# Patient Record
Sex: Female | Born: 1985 | Hispanic: Yes | Marital: Single | Smoking: Never smoker
Health system: Southern US, Community
[De-identification: ages and names within clinical notes are randomized; demographics above are authoritative.]

---
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
## 2010-12-27 ENCOUNTER — Emergency Department: Payer: Self-pay | Admitting: Emergency Medicine

## 2011-10-22 ENCOUNTER — Emergency Department: Payer: Self-pay | Admitting: Emergency Medicine

## 2011-10-22 LAB — URINALYSIS, COMPLETE
Bilirubin,UR: NEGATIVE
Glucose,UR: NEGATIVE mg/dL (ref 0–75)
Nitrite: NEGATIVE
Protein: 100
RBC,UR: 1671 /HPF (ref 0–5)
Specific Gravity: 1.021 (ref 1.003–1.030)
WBC UR: 117 /HPF (ref 0–5)

## 2011-10-22 LAB — COMPREHENSIVE METABOLIC PANEL
Anion Gap: 13 (ref 7–16)
Calcium, Total: 8.7 mg/dL (ref 8.5–10.1)
Chloride: 105 mmol/L (ref 98–107)
Co2: 22 mmol/L (ref 21–32)
Creatinine: 0.62 mg/dL (ref 0.60–1.30)
EGFR (Non-African Amer.): >60
Potassium: 3.4 mmol/L — ABNORMAL LOW (ref 3.5–5.1)
SGOT(AST): 20 U/L (ref 15–37)
Sodium: 140 mmol/L (ref 136–145)

## 2011-10-22 LAB — CBC WITH DIFFERENTIAL/PLATELET
Basophil #: 0 10*3/uL (ref 0.0–0.1)
Eosinophil #: 0.1 10*3/uL (ref 0.0–0.7)
Lymphocyte #: 2.2 10*3/uL (ref 1.0–3.6)
MCHC: 34 g/dL (ref 32.0–36.0)
MCV: 92 fL (ref 80–100)
Monocyte %: 5.5 %
Platelet: 200 10*3/uL (ref 150–440)
RDW: 11.8 % (ref 11.5–14.5)
WBC: 7.7 10*3/uL (ref 3.6–11.0)

## 2011-10-22 LAB — PREGNANCY, URINE: Pregnancy Test, Urine: NEGATIVE m[IU]/mL

## 2011-10-31 ENCOUNTER — Ambulatory Visit: Payer: Self-pay | Admitting: Urology

## 2012-08-10 ENCOUNTER — Emergency Department: Payer: Self-pay | Admitting: Emergency Medicine

## 2012-08-10 LAB — COMPREHENSIVE METABOLIC PANEL
Albumin: 4.1 g/dL (ref 3.4–5.0)
BUN: 6 mg/dL — ABNORMAL LOW (ref 7–18)
Bilirubin,Total: 0.4 mg/dL (ref 0.2–1.0)
Chloride: 104 mmol/L (ref 98–107)
Co2: 26 mmol/L (ref 21–32)
Creatinine: 0.67 mg/dL (ref 0.60–1.30)
EGFR (African American): >60
EGFR (Non-African Amer.): >60
Glucose: 101 mg/dL — ABNORMAL HIGH (ref 65–99)
Osmolality: 277 (ref 275–301)
SGPT (ALT): 33 U/L (ref 12–78)
Sodium: 140 mmol/L (ref 136–145)
Total Protein: 8.2 g/dL (ref 6.4–8.2)

## 2012-08-10 LAB — URINALYSIS, COMPLETE
Bacteria: NONE SEEN
Glucose,UR: NEGATIVE mg/dL (ref 0–75)
Ketone: NEGATIVE
Nitrite: NEGATIVE
Ph: 7 (ref 4.5–8.0)
Specific Gravity: 1.005 (ref 1.003–1.030)
Squamous Epithelial: 3
WBC UR: 2 /HPF (ref 0–5)

## 2012-08-10 LAB — CBC
HCT: 37.9 % (ref 35.0–47.0)
MCH: 31.5 pg (ref 26.0–34.0)
MCHC: 35 g/dL (ref 32.0–36.0)
RDW: 12.4 % (ref 11.5–14.5)

## 2012-12-15 IMAGING — CR DG ABDOMEN 1V
1 series · 1 of 1 positions shown · non-contrast
Comparison: none

REASON FOR EXAM: back pain  calculus
COMMENTS:

[t abdomen supine]
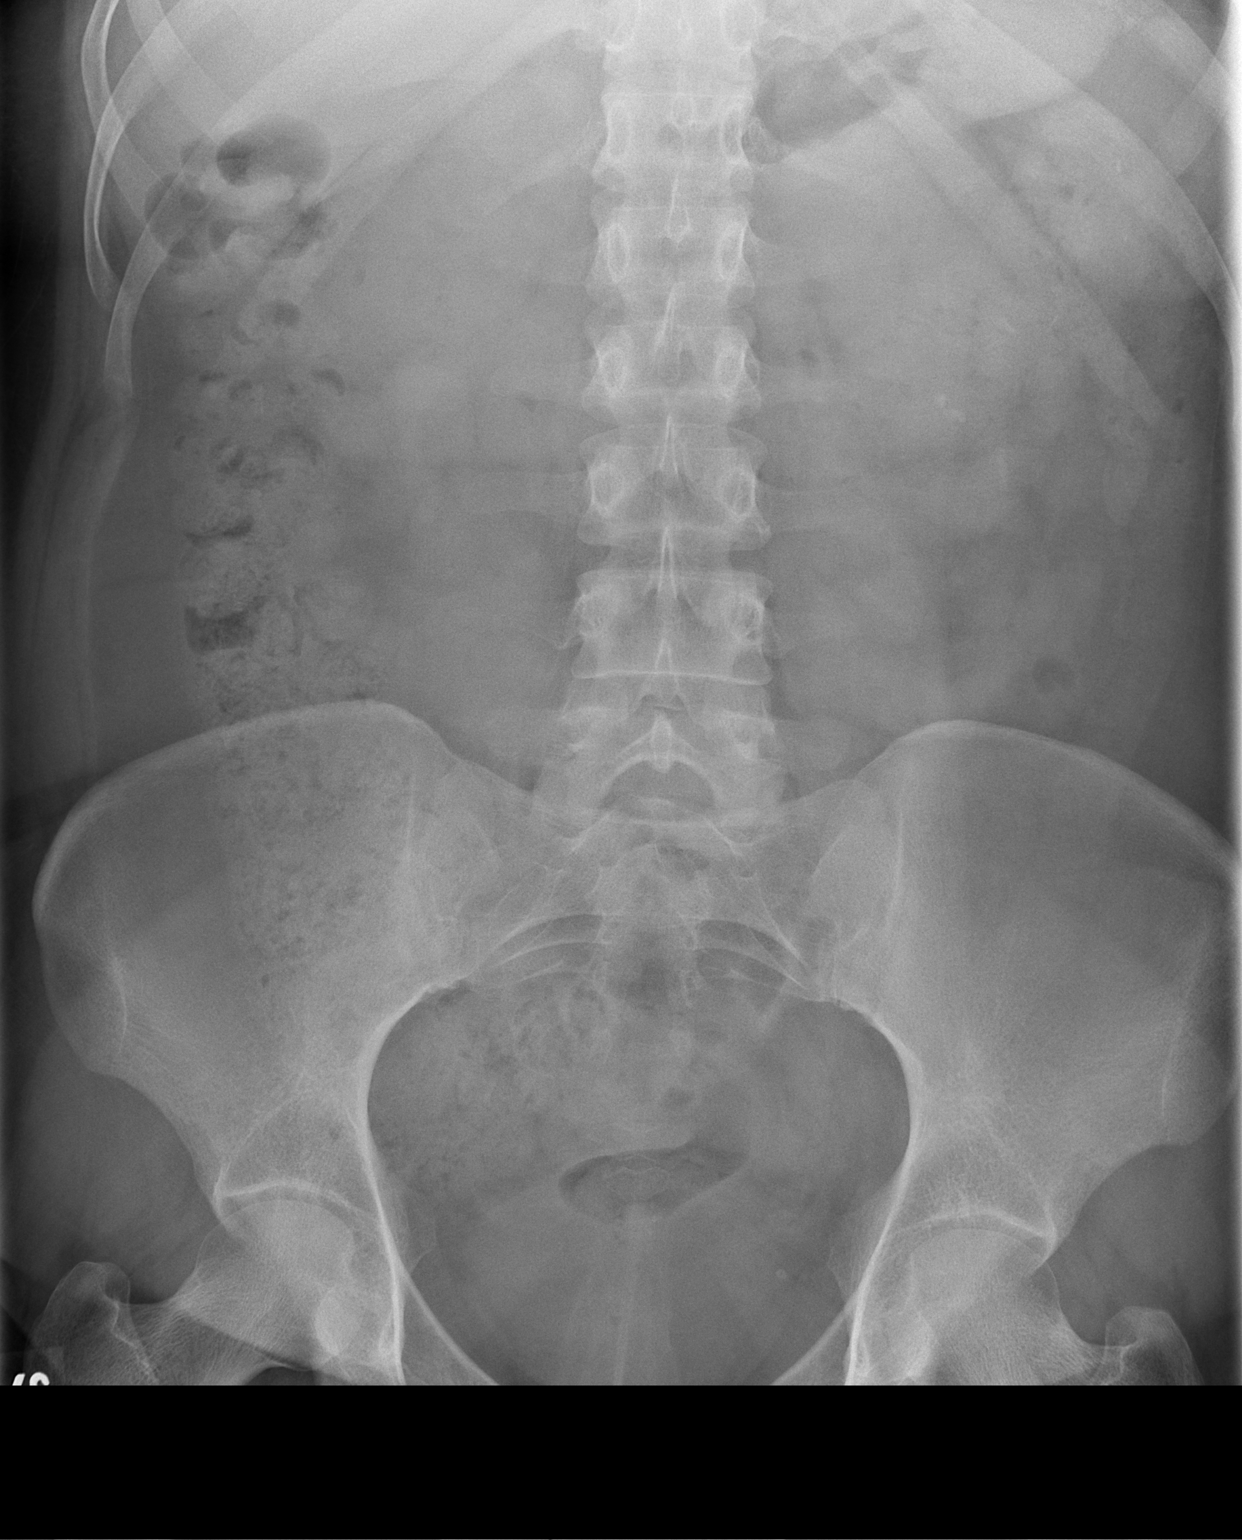

[1 of 1 positions shown; findings below may reference images not displayed]

PROCEDURE:     DXR - DXR KIDNEY URETER BLADDER  - October 31, 2011  [DATE]

RESULT:     There are observed a few calcifications at the lower and midpole
region of the left kidney. No right renal stones are identified. No ureteral
calcifications are identified on either side. The left ureteral stone
previously observed at CT is not definitely identified. The bowel gas
pattern is normal in appearance. The osseous structures show no significant
abnormalities.
IMPRESSION: Left nephrolithiasis.

## 2013-06-17 ENCOUNTER — Emergency Department: Payer: Self-pay | Admitting: Emergency Medicine

## 2013-06-17 LAB — URINALYSIS, COMPLETE
Bilirubin,UR: NEGATIVE
Glucose,UR: NEGATIVE mg/dL (ref 0–75)
Hyaline Cast: 2
Nitrite: NEGATIVE
Ph: 7 (ref 4.5–8.0)
Protein: NEGATIVE
RBC,UR: 9 /HPF (ref 0–5)
Specific Gravity: 1.012 (ref 1.003–1.030)
Squamous Epithelial: 16

## 2013-06-17 LAB — BASIC METABOLIC PANEL
Anion Gap: 7 (ref 7–16)
Calcium, Total: 8.7 mg/dL (ref 8.5–10.1)
Chloride: 105 mmol/L (ref 98–107)
Co2: 22 mmol/L (ref 21–32)
Creatinine: 0.61 mg/dL (ref 0.60–1.30)
EGFR (African American): >60
EGFR (Non-African Amer.): >60
Sodium: 134 mmol/L — ABNORMAL LOW (ref 136–145)

## 2013-06-17 LAB — CBC
HGB: 13.4 g/dL (ref 12.0–16.0)
MCV: 90 fL (ref 80–100)
Platelet: 218 10*3/uL (ref 150–440)
RBC: 4.3 10*6/uL (ref 3.80–5.20)
WBC: 9.5 10*3/uL (ref 3.6–11.0)

## 2013-06-17 LAB — HCG, QUANTITATIVE, PREGNANCY: Beta Hcg, Quant.: 84027 m[IU]/mL — ABNORMAL HIGH

## 2013-07-21 ENCOUNTER — Encounter: Payer: Self-pay | Admitting: Obstetrics and Gynecology

## 2013-07-23 ENCOUNTER — Encounter: Payer: Self-pay | Admitting: Obstetrics and Gynecology

## 2013-08-25 ENCOUNTER — Encounter: Payer: Self-pay | Admitting: Obstetrics and Gynecology

## 2013-09-08 ENCOUNTER — Encounter: Payer: Self-pay | Admitting: Obstetrics and Gynecology

## 2013-09-30 ENCOUNTER — Observation Stay: Payer: Self-pay | Admitting: Obstetrics and Gynecology

## 2013-11-16 ENCOUNTER — Ambulatory Visit: Payer: Self-pay | Admitting: Advanced Practice Midwife

## 2013-11-20 ENCOUNTER — Ambulatory Visit: Payer: Self-pay | Admitting: Advanced Practice Midwife

## 2013-12-21 ENCOUNTER — Ambulatory Visit: Payer: Self-pay | Admitting: Advanced Practice Midwife

## 2014-01-02 ENCOUNTER — Observation Stay: Payer: Self-pay | Admitting: Obstetrics and Gynecology

## 2014-01-02 LAB — URINALYSIS, COMPLETE
BACTERIA: NONE SEEN
Bilirubin,UR: NEGATIVE
Glucose,UR: NEGATIVE mg/dL (ref 0–75)
Ketone: NEGATIVE
Nitrite: NEGATIVE
Ph: 7 (ref 4.5–8.0)
Protein: NEGATIVE
Specific Gravity: 1.005 (ref 1.003–1.030)
Squamous Epithelial: 3

## 2014-01-04 LAB — URINE CULTURE

## 2014-01-16 ENCOUNTER — Inpatient Hospital Stay: Payer: Self-pay | Admitting: Obstetrics and Gynecology

## 2014-01-16 LAB — CBC WITH DIFFERENTIAL/PLATELET
Basophil #: 0.1 10*3/uL (ref 0.0–0.1)
Basophil %: 0.7 %
EOS ABS: 0.1 10*3/uL (ref 0.0–0.7)
Eosinophil %: 0.9 %
HCT: 41.2 % (ref 35.0–47.0)
HGB: 13.9 g/dL (ref 12.0–16.0)
Lymphocyte #: 1.8 10*3/uL (ref 1.0–3.6)
Lymphocyte %: 17.6 %
MCH: 32.2 pg (ref 26.0–34.0)
MCHC: 33.7 g/dL (ref 32.0–36.0)
MCV: 96 fL (ref 80–100)
Monocyte #: 0.7 x10 3/mm (ref 0.2–0.9)
Monocyte %: 7.2 %
NEUTROS ABS: 7.4 10*3/uL — AB (ref 1.4–6.5)
NEUTROS PCT: 73.6 %
PLATELETS: 178 10*3/uL (ref 150–440)
RBC: 4.31 10*6/uL (ref 3.80–5.20)
RDW: 13.4 % (ref 11.5–14.5)
WBC: 10.1 10*3/uL (ref 3.6–11.0)

## 2014-01-17 LAB — HEMATOCRIT: HCT: 34.4 % — ABNORMAL LOW (ref 35.0–47.0)

## 2014-01-18 LAB — PATHOLOGY REPORT

## 2014-02-12 ENCOUNTER — Emergency Department: Payer: Self-pay | Admitting: Emergency Medicine

## 2014-02-12 LAB — COMPREHENSIVE METABOLIC PANEL
ALBUMIN: 3.8 g/dL (ref 3.4–5.0)
ALK PHOS: 116 U/L
ALT: 103 U/L — AB (ref 12–78)
Anion Gap: 6 — ABNORMAL LOW (ref 7–16)
BILIRUBIN TOTAL: 0.7 mg/dL (ref 0.2–1.0)
BUN: 13 mg/dL (ref 7–18)
CHLORIDE: 105 mmol/L (ref 98–107)
CO2: 27 mmol/L (ref 21–32)
Calcium, Total: 9.3 mg/dL (ref 8.5–10.1)
Creatinine: 0.79 mg/dL (ref 0.60–1.30)
EGFR (African American): >60
GLUCOSE: 104 mg/dL — AB (ref 65–99)
OSMOLALITY: 276 (ref 275–301)
POTASSIUM: 3.5 mmol/L (ref 3.5–5.1)
SGOT(AST): 103 U/L — ABNORMAL HIGH (ref 15–37)
SODIUM: 138 mmol/L (ref 136–145)
Total Protein: 7.8 g/dL (ref 6.4–8.2)

## 2014-02-12 LAB — URINALYSIS, COMPLETE
Bacteria: NONE SEEN
Bilirubin,UR: NEGATIVE
Glucose,UR: NEGATIVE mg/dL (ref 0–75)
Ketone: NEGATIVE
LEUKOCYTE ESTERASE: NEGATIVE
NITRITE: NEGATIVE
Ph: 7 (ref 4.5–8.0)
Protein: NEGATIVE
Specific Gravity: 1.013 (ref 1.003–1.030)

## 2014-02-12 LAB — CBC WITH DIFFERENTIAL/PLATELET
Basophil #: 0 10*3/uL (ref 0.0–0.1)
Basophil %: 0.2 %
Eosinophil #: 0.1 10*3/uL (ref 0.0–0.7)
Eosinophil %: 0.9 %
HCT: 41.6 % (ref 35.0–47.0)
HGB: 13.8 g/dL (ref 12.0–16.0)
LYMPHS PCT: 12.2 %
Lymphocyte #: 1.6 10*3/uL (ref 1.0–3.6)
MCH: 31.1 pg (ref 26.0–34.0)
MCHC: 33.2 g/dL (ref 32.0–36.0)
MCV: 94 fL (ref 80–100)
Monocyte #: 0.6 x10 3/mm (ref 0.2–0.9)
Monocyte %: 4.4 %
Neutrophil #: 10.7 10*3/uL — ABNORMAL HIGH (ref 1.4–6.5)
Neutrophil %: 82.3 %
Platelet: 203 10*3/uL (ref 150–440)
RBC: 4.44 10*6/uL (ref 3.80–5.20)
RDW: 12.8 % (ref 11.5–14.5)
WBC: 13 10*3/uL — ABNORMAL HIGH (ref 3.6–11.0)

## 2014-02-12 LAB — PREGNANCY, URINE: Pregnancy Test, Urine: NEGATIVE m[IU]/mL

## 2014-02-13 LAB — LIPASE, BLOOD: Lipase: 291 U/L (ref 73–393)

## 2015-01-13 NOTE — Op Note (Signed)
PATIENT NAME:  Brittany Petersen, Brittany Petersen MR#:  [id number] DATE OF BIRTH:  [birth year]  DATE OF PROCEDURE:  01/16/2014  PREOPERATIVE DIAGNOSES:  1.  At 39 and +1 weeks.  2.  Active contractions.  3.  Elective repeat cesarean section.  4.  Elective permanent sterilization.   PROCEDURE PERFORMED: 1.  Repeat low transverse cesarean section.  2.  Pomeroy bilateral tubal ligation.   ANESTHESIA: Spinal.   SURGEON: Suzy Bouchard, M.D.   FIRST ASSISTANT: Yetta Barre.   INDICATION: This is a 29 year old gravida 2, para 1, EDC of 01/22/2014. The patient had elected for a repeat cesarean section and elective permanent sterilization. The patient came in the day of the procedure with active contractions. The cervix was 1 to 2 cm dilated.   PROCEDURE IN DETAIL: After adequate spinal anesthesia and receiving 2 grams IV Ancef, the patient was placed in the dorsal supine position with a hip roll under the right side. The patient's abdomen was prepped and draped in normal sterile fashion. Pfannenstiel incision was made 2 fingerbreadths above the symphysis pubis. Sharp dissection was used to identify the fascia. The fascia was opened in the midline and opened in a transverse fashion. The superior aspect of the fascia was grasped with Kocher clamps and the recti muscles were dissected free. The inferior aspect of the fascia was grasped with Kocher clamps and pyramidalis muscle was dissected free. The peritoneal cavity was opened sharply. There were some omental adhesions that were easily placed to the side. Vesicouterine  peritoneal fold was identified and a bladder flap was created and the bladder was reflected inferiorly. A low transverse uterine incision was made. Upon entry into the endometrial cavity, clear fluid resulted. The incision was extended with blunt transverse traction. The fetal head was brought to the incision and vacuum was applied. A vacuum was applied to the occiput and gentle pull allowed for  delivery of the head and the vacuum was removed. The shoulders and body were delivered without difficulty. A small, vigorous female whose cord was doubly clamped and passed to nursery staff to assign Apgar scores of 9 and 9, weight 2640 grams. The placenta was manually removed. The uterus was exteriorized and the patient received IV Pitocin. Endometrial cavity was wiped clean with laparotomy tape and the cervix was opened with a ring forceps. The uterine incision was closed with 1 chromic suture in a running locking fashion with good approximation of edges. One additional figure-of-eight suture was required for good hemostasis. The posterior cul-de-sac was irrigated and suctioned. Attention was then directed to the patient's right fallopian tube, which was grasped in the midportion and 2 separate 0 plain gut sutures were applied and a 1.5 cm portion of fallopian tube was removed. Good hemostasis was noted. A similar procedure was repeated on the patient's left fallopian tube; again, 2 separate 0 plain gut sutures were applied at the midportion of the fallopian tube and a 1.5 cm portion of the fallopian tube removed. Good hemostasis was noted. The uterus was placed back into the abdominal cavity and the paracolic gutters were wiped clean bilaterally, and the tubal ligation sites appeared hemostatic. The uterine incision appeared hemostatic. The fascia was then closed with 0 Vicryl in a running nonlocking fashion. Subcutaneous tissues were irrigated and bovied for hemostasis, and the skin was reapproximated with staples. The patient tolerated the procedure well was taken to the recovery room in good condition.   INTRAOPERATIVE FLUIDS: 1300 mL.   ESTIMATED BLOOD LOSS: 600 mL.  ____________________________ Suzy Bouchardhomas J. Rylin Saez, MD tjs:dmm D: 01/16/2014 15:16:06 ET T: 01/16/2014 20:08:16 ET JOB#: [id number]  cc: Suzy Bouchardhomas J. Madelin Weseman, MD, <Dictator> Suzy BouchardHOMAS J Jakhiya Brower MD ELECTRONICALLY SIGNED  01/21/2014 12:06

## 2015-01-30 NOTE — H&P (Signed)
L&D Evaluation:  History:  HPI 29 yo G2P1001 with LMP of 04/17/13 & EDD of 01/22/14 and 01/25/14 by US at 13 1/7 here for labor with UC's q 3 mins and cx of 2cms her for RCS. PNC significant for Tdap given at 28 weeks, Obesity, A1GDM, desires RCS and BTL. No VB,no decreased FM, ROM. GBs neg   Presents with contractions, Active Labor pattern   Patient's Medical History A1GDM, GBs neg, Obesity, UTI,   Patient's Surgical History Fx Rt foot,   Medications Pre Natal Vitamins   Allergies NKDA   Social History none   Family History Non-Contributory   ROS:  ROS All systems were reviewed.  HEENT, CNS, GI, GU, Respiratory, CV, Renal and Musculoskeletal systems were found to be normal.   Exam:  Vital Signs stable   General no apparent distress   Mental Status clear   Chest clear   Heart normal sinus rhythm, no murmur/gallop/rubs   Abdomen gravid, non-tender   Estimated Fetal Weight Average for gestational age   Fetal Position vtx   Back no CVAT   Edema 1+   Reflexes 1+   Clonus negative   Pelvic 1-2cms   Mebranes Intact   FHT normal rate with no decels   Ucx regular   Skin dry   Lymph no lymphadenopathy   Impression:  Impression active labor, IUP at 38 5/7 weeks in active labor   Plan:  Plan monitor contractions and for cervical change, Admit for RCS with BTL   Comments Risks, benefits and alternatives including bleeding, damage to internal organs, infeciton disc with pt and she accepts the risk. Anesthesia risks will be discussed by anesthesia via Interpreter. Prepping for a LTCS/BTL. Anesthesia, Dr. Randel PiggPolin called and made aware of the C-section.   Electronic Signatures: Sharee PimpleJones, Caron W (CNM)  (Signed 27-Apr-15 13:42)  Authored: L&D Evaluation   Last Updated: 27-Apr-15 13:42 by Sharee PimpleJones, Caron W (CNM)

## 2016-02-27 ENCOUNTER — Emergency Department: Payer: Self-pay

## 2016-02-27 ENCOUNTER — Encounter: Payer: Self-pay | Admitting: Emergency Medicine

## 2016-02-27 ENCOUNTER — Emergency Department
Admission: EM | Admit: 2016-02-27 | Discharge: 2016-02-27 | Disposition: A | Discharge status: Home / Self Care | Payer: Self-pay | Attending: Emergency Medicine | Admitting: Emergency Medicine

## 2016-02-27 DIAGNOSIS — N23 Unspecified renal colic: Secondary | ICD-10-CM | POA: Insufficient documentation

## 2016-02-27 LAB — COMPREHENSIVE METABOLIC PANEL
ALT: 30 U/L (ref 14–54)
AST: 36 U/L (ref 15–41)
Albumin: 4.3 g/dL (ref 3.5–5.0)
Alkaline Phosphatase: 70 U/L (ref 38–126)
Anion gap: 14 (ref 5–15)
BUN: 6 mg/dL (ref 6–20)
CHLORIDE: 104 mmol/L (ref 101–111)
CO2: 19 mmol/L — AB (ref 22–32)
CREATININE: 0.55 mg/dL (ref 0.44–1.00)
Calcium: 8.9 mg/dL (ref 8.9–10.3)
Glucose, Bld: 148 mg/dL — ABNORMAL HIGH (ref 65–99)
POTASSIUM: 3.3 mmol/L — AB (ref 3.5–5.1)
Sodium: 137 mmol/L (ref 135–145)
Total Bilirubin: 0.5 mg/dL (ref 0.3–1.2)
Total Protein: 7.9 g/dL (ref 6.5–8.1)

## 2016-02-27 LAB — URINALYSIS COMPLETE WITH MICROSCOPIC (ARMC ONLY)
BILIRUBIN URINE: NEGATIVE
Glucose, UA: NEGATIVE mg/dL
LEUKOCYTES UA: NEGATIVE
Nitrite: NEGATIVE
PH: 6 (ref 5.0–8.0)
PROTEIN: 30 mg/dL — AB
Specific Gravity, Urine: 1.017 (ref 1.005–1.030)

## 2016-02-27 LAB — CBC
HEMATOCRIT: 37.9 % (ref 35.0–47.0)
HEMOGLOBIN: 13 g/dL (ref 12.0–16.0)
MCH: 29.3 pg (ref 26.0–34.0)
MCHC: 34.3 g/dL (ref 32.0–36.0)
MCV: 85.3 fL (ref 80.0–100.0)
Platelets: 233 10*3/uL (ref 150–440)
RBC: 4.44 MIL/uL (ref 3.80–5.20)
RDW: 13.9 % (ref 11.5–14.5)
WBC: 7.4 10*3/uL (ref 3.6–11.0)

## 2016-02-27 LAB — LIPASE, BLOOD: LIPASE: 22 U/L (ref 11–51)

## 2016-02-27 LAB — POCT PREGNANCY, URINE: PREG TEST UR: NEGATIVE

## 2016-02-27 MED ORDER — KETOROLAC TROMETHAMINE 30 MG/ML IJ SOLN
30.0000 mg | Freq: Once | INTRAMUSCULAR | Status: AC
  Administered 2016-02-27: 30 mg via INTRAVENOUS
  Filled 2016-02-27: qty 1

## 2016-02-27 MED ORDER — ONDANSETRON HCL 4 MG/2ML IJ SOLN
4.0000 mg | Freq: Once | INTRAMUSCULAR | Status: DC
  Filled 2016-02-27: qty 2

## 2016-02-27 MED ORDER — ONDANSETRON HCL 4 MG/2ML IJ SOLN
4.0000 mg | Freq: Once | INTRAMUSCULAR | Status: AC
  Administered 2016-02-27: 4 mg via INTRAVENOUS
  Filled 2016-02-27: qty 2

## 2016-02-27 MED ORDER — IBUPROFEN 600 MG PO TABS: 600.0000 mg | ORAL_TABLET | Freq: Four times a day (QID) | ORAL | Status: AC | PRN

## 2016-02-27 MED ORDER — ONDANSETRON 4 MG PO TBDP: 4.0000 mg | ORAL_TABLET | Freq: Four times a day (QID) | ORAL | Status: AC | PRN

## 2016-02-27 MED ORDER — ONDANSETRON HCL 4 MG/2ML IJ SOLN
4.0000 mg | Freq: Once | INTRAMUSCULAR | Status: AC
  Administered 2016-02-27: 4 mg via INTRAVENOUS

## 2016-02-27 MED ORDER — SODIUM CHLORIDE 0.9 % IV BOLUS (SEPSIS)
1000.0000 mL | Freq: Once | INTRAVENOUS | Status: AC
  Administered 2016-02-27: 1000 mL via INTRAVENOUS

## 2016-02-27 MED ORDER — ONDANSETRON HCL 4 MG/2ML IJ SOLN
INTRAMUSCULAR | Status: AC
  Administered 2016-02-27: 4 mg via INTRAVENOUS
  Filled 2016-02-27: qty 2

## 2016-02-27 MED ORDER — ONDANSETRON HCL 4 MG/2ML IJ SOLN
4.0000 mg | Freq: Once | INTRAMUSCULAR | Status: AC | PRN
Start: 2016-02-27 — End: 2016-02-27
  Administered 2016-02-27: 4 mg via INTRAVENOUS

## 2016-02-27 NOTE — ED Provider Notes (Signed)
[facility] Emergency Department Provider Note ____________________________________________  Time seen: Approximately 1:38 PM  I have reviewed the triage vital signs and the nursing notes.   HISTORY  Patient is Spanish-speaking and history and physical was assisted by hospital interpreter, Maryjane Hurter  Chief Complaint Pelvic Pain and Emesis   HPI Brittany Petersen is a 30 y.o. female was in her usual state of good health until 7 AM when she developed left lower quadrant and left flank pain similar to when she had a kidney stone 1-2 years ago. She has had yellow emesis. Pain is severe and is not better or worse with anything. She has not had any diarrhea. She had her regular period May 29-6/3 does not think there is any way she could be pregnant. She has not had any vaginal discomfort or discharge. She has not noticed any hematuria  When she had her past kidney stone, she did follow up with the urologist and they stone passed spontaneously. She is status post cholecystectomy in March. She has had 2 C-sections.  History reviewed. No pertinent past medical history.  There are no active problems to display for this patient.   History reviewed. No pertinent past surgical history.  No current outpatient prescriptions on file.  Allergies Review of patient's allergies indicates no known allergies.  No family history on file.  Social History Social History  Substance Use Topics  . Smoking status: Never Smoker   . Smokeless tobacco: None  . Alcohol Use: No    Review of Systems Constitutional: No fever/chills Eyes: No visual changes. ENT: No sore throat. Cardiovascular: Denies chest pain. Respiratory: Denies shortness of breath. Gastrointestinal:See history of present illness Genitourinary: Negative for dysuria. Musculoskeletal: Negative for back pain. Skin: Negative for rash. Neurological: Negative for headaches, focal weakness or numbness.  10-point  ROS otherwise negative.  ____________________________________________   PHYSICAL EXAM:  VITAL SIGNS: ED Triage Vitals  Enc Vitals Group     BP 02/27/16 1233 127/80 mmHg     Pulse Rate 02/27/16 1233 116     Resp 02/27/16 1233 20     Temp 02/27/16 1233 98.1 F (36.7 C)     Temp Source 02/27/16 1233 Oral     SpO2 02/27/16 1233 99 %     Weight 02/27/16 1233 175 lb (79.379 kg)     Height 02/27/16 1233  (1.575 m)     Head Cir --      Peak Flow --      Pain Score 02/27/16 1234 10     Pain Loc --      Pain Edu? --      Excl. in GC? --    Constitutional: Alert and oriented. Appears uncomfortable; standing and walking around the room  Eyes: Conjunctivae are normal. PERRL. EOMI. Head: Atraumatic. Nose: No congestion/rhinnorhea. Mouth/Throat: Mucous membranes are moist.  Oropharynx non-erythematous. Neck: No stridor.   Cardiovascular: Normal rate, regular rhythm. Grossly normal heart sounds.  Good peripheral circulation. Respiratory: Normal respiratory effort.  No retractions. Lungs CTAB. Gastrointestinal: Soft; mild tenderness to palpation left lower quadrant without rebound or guarding. No distention. No abdominal bruits. No CVA tenderness. Musculoskeletal: No lower extremity tenderness nor edema.   Neurologic:  Normal speech and language. No gross focal neurologic deficits are appreciated. No gait instability. Skin:  Skin is warm, dry and intact. No rash noted. Psychiatric: Mood and affect are normal. Speech and behavior are normal.  ____________________________________________   LABS (all labs ordered are listed, but only  abnormal results are displayed)  Labs Reviewed  COMPREHENSIVE METABOLIC PANEL - Abnormal; Notable for the following:    Potassium 3.3 (*)    CO2 19 (*)    Glucose, Bld 148 (*)    All other components within normal limits  URINALYSIS COMPLETEWITH MICROSCOPIC ([facility] ONLY) - Abnormal; Notable for the following:    Color, Urine YELLOW (*)    APPearance  HAZY (*)    Ketones, ur 1+ (*)    Hgb urine dipstick 3+ (*)    Protein, ur 30 (*)    Bacteria, UA RARE (*)    Squamous Epithelial / LPF 0-5 (*)    All other components within normal limits  LIPASE, BLOOD  CBC  POCT PREGNANCY, URINE  Urinalysis red blood cells-too numerous to count ____________________________________________ ____________________________________________  RADIOLOGY KUB-pending Renal ultrasound-pending ____________________________________________  ____________________________________________   INITIAL IMPRESSION / ASSESSMENT AND PLAN / ED COURSE  Pertinent labs & imaging results that were available during my care of the patient were reviewed by me and considered in my medical decision making (see chart for details).  Patient appears very uncomfortable; given that this feels like her past kidney stone and she currently has too numerous to count red blood cells in her urine while she is not menstruating, I have high suspicion for kidney stone. Will check KUB and kidney ultrasound.  ED care xferred to Dr. Fanny BienQuale ____________________________________________   FINAL CLINICAL IMPRESSION(S) / ED DIAGNOSES  Final diagnoses:  None      New prescriptions started this visit New Prescriptions   No medications on file     Maurilio LovelyNoelle Colm Lyford, MD 03/10/16 2339

## 2016-02-27 NOTE — ED Notes (Signed)
Per interpreter pt presents with lower left abd and pelvic pain and vomiting started this am. Pt denies any diarrhea.

## 2016-02-27 NOTE — ED Provider Notes (Signed)
-----------------------------------------   5:45 PM on 02/27/2016 -----------------------------------------  Patient reevaluated with Spanish interpreter. She reports to me that her pain has improved but still feels nauseated. She is having left lower quadrant discomfort that started earlier today. Based on her KUB showing no clear stone, though hematuria is noted. Ultrasound also does not demonstrate evidence of hydronephrosis. Based upon this, I have ordered ultrasound of the pelvis to rule out torsion as a possible alternative cause for her lower left-sided pelvic pain. Patient is requesting additional antiemetic, but states does not really require any additional pain medicine at this time.  US Pelvis Complete (Final result) Result time: 02/27/16 18:54:51   Final result by Rad Results In Interface (02/27/16 18:54:51)   Narrative:   CLINICAL DATA: 30 year old female with left lower quadrant abdominal pain. Evaluate for torsion.  EXAM: TRANSABDOMINAL AND TRANSVAGINAL ULTRASOUND OF PELVIS  DOPPLER ULTRASOUND OF OVARIES  TECHNIQUE: Both transabdominal and transvaginal ultrasound examinations of the pelvis were performed. Transabdominal technique was performed for global imaging of the pelvis including uterus, ovaries, adnexal regions, and pelvic cul-de-sac.  It was necessary to proceed with endovaginal exam following the transabdominal exam to visualize the endometrium and the ovaries. Color and duplex Doppler ultrasound was utilized to evaluate blood flow to the ovaries.  COMPARISON: None.  FINDINGS: Uterus  The uterus is anteverted measures 0.6 x 3.4 x 6.2 cm. Trace amount of fluid is noted within the lower uterus and endocervical canal. No fibroids or other mass visualized.  Endometrium  Thickness: 8 mm. No focal abnormality visualized.  Right ovary  Measurements: 2.4 x 2.5 x 2.6 cm. Normal appearance/no adnexal mass.  Left ovary  Measurements: 2.5 x 2.1 x 2.6 cm.  Normal appearance/no adnexal mass.  Pulsed Doppler evaluation of both ovaries demonstrates normal low-resistance arterial and venous waveforms.  Other findings  No abnormal free fluid.  IMPRESSION: Unremarkable pelvic ultrasound. Bilateral ovarian Doppler flow demonstrated.   Electronically Signed By: Elgie CollardArash Radparvar M.D. On: 02/27/2016 18:54         Patient improved after antibiotics. Ultrasound does not demonstrate acute ovarian pathology or torsion. Based on the hematuria, left flank pain likely patient passing a small kidney stone. We'll treat as such with careful return precautions.  Return precautions and treatment recommendations and follow-up provided for the patient who is agreeable with the plan.     Sharyn CreamerMark Rojean Ige, MD 02/28/16 [phone]

## 2016-02-27 NOTE — Discharge Instructions (Signed)
Clculos renales (Kidney Stones) Los clculos renales (urolitiasis) son masas slidas que se forman en el interior de los riones. El dolor intenso es causado por el movimiento de la piedra a travs del tracto urinario. Cuando la piedra se mueve, el urter hace un espasmo alrededor de la misma. El clculo generalmente se elimina con la orina.  CAUSAS   Un trastorno que hace que ciertas glndulas del cuello produzcan demasiada hormona paratiroidea (hiperparatiroidismo primario).  Una acumulacin de cristales de cido rico, similar a la gota en las articulaciones.  Estrechamiento (constriccin) del urter.  Obstruccin en el rin presente al nacer (obstruccin congnita).  Cirugas previas del rin o los urteres.  Numerosas infecciones renales. SNTOMAS   Ganas de vomitar (nuseas).  Devolver la comida (vomitar).  Sangre en la orina (hematuria).  Dolor que generalmente se expande (irradia) hacia la ingle.  Ganas de orinar con frecuencia o de manera urgente. DIAGNSTICO   Historia clnica y examen fsico.  Anlisis de sangre y orina.  Tomografa computada.  En algunos casos se realiza un examen del interior de la vejiga (citoscopa). TRATAMIENTO   Observacin.  Aumentar la ingesta de lquidos.  Litotricia extracorprea con ondas de choque: es un procedimiento no invasivo que utiliza ondas de choque para romper los clculos renales.  Ser necesaria la ciruga si tiene dolor muy intenso o la obstruccin persiste. Hay varios procedimientos quirrgicos. La mayora de los procedimientos se realizan con el uso de pequeos instrumentos. Slo es necesario realizar pequeas incisiones para acomodar estos instrumentos, por lo tanto el tiempo de recuperacin es mnimo. El tamao, la ubicacin y la composicin qumica de los clculos son variables importantes que determinarn la eleccin correcta de tratamiento para su caso. Comunquese con su mdico para comprender mejor su  situacin, de modo que pueda minimizar los riesgos de lesiones para usted y su rin.  INSTRUCCIONES PARA EL CUIDADO EN EL HOGAR   Beba gran cantidad de lquido para mantener la orina de tono claro o color amarillo plido. Esto ayudar a eliminar las piedras o los fragmentos.  Cuele la orina con el colador que le han provisto. Guarde todas las partculas y piedras para que las vea el profesional que lo asiste. Puede ser tan pequea como un grano de sal. Es muy importante usar el colador cada vez que orine. La recoleccin de piedras permitir al mdico analizar y verificar que efectivamente ha eliminado una piedra. El anlisis de la piedra con frecuencia permitir identificar qu puede hacer para reducir la incidencia de las recurrencias.  Slo tome medicamentos de venta libre o recetados para calmar el dolor, el malestar o bajar la fiebre, segn las indicaciones de su mdico.  Concurra a todas las visitas de control como se lo haya indicado el mdico. Esto es importante.  Si se lo indica, hgase radiografas. La ausencia de dolor no siempre significa que las piedras se han eliminado. Puede ser que simplemente hayan dejado de moverse. Si el paso de orina permanece completamente obstruido, puede causar prdida de la funcin renal o simplemente la destruccin del rin. Es su responsabilidad completar el seguimiento y las radiografas. Las ecografas del rin pueden mostrar una obstruccin y el estado del rin. Las ecografas no se asocian con la radiacin y pueden realizarse fcilmente en cuestin de minutos.  Haga cambios en la dieta diaria como se lo haya indicado el mdico. Es posible que le indiquen lo siguiente:  Limitar la cantidad de sal que consume.  Consumir 5 o ms porciones de frutas   y verduras por da.  Limitar la cantidad de carne, carne de ave, pescado y huevos que consume.  Recoger una muestra de orina durante 24 horas como se lo haya indicado el mdico. Tal vez tenga que recoger  otra muestra de orina cada 6 o 12 meses. SOLICITE ATENCIN MDICA SI:  Siente dolor que no responde a los analgsicos que le recetaron. SOLICITE ATENCIN MDICA DE INMEDIATO SI:   No puede controlar el dolor con los medicamentos que le han recetado.  Siente escalofros o fiebre.  La gravedad o la intensidad del dolor aumenta durante 18 horas y no se alivia con los analgsicos.  Presenta un nuevo episodio de dolor abdominal.  Sufre mareos o se desmaya.  No puede orinar.   Esta informacin no tiene como fin reemplazar el consejo del mdico. Asegrese de hacerle al mdico cualquier pregunta que tenga.   Document Released: 09/08/2005 Document Revised: 05/30/2015 Elsevier Interactive Patient Education 2016 [employer]vier Inc.  

## 2016-02-27 NOTE — ED Notes (Signed)

## 2016-08-07 IMAGING — US US RENAL
1 series · 14 of 25 positions shown · non-contrast
Comparison: CT abdomen and pelvis of 10/22/2011

CLINICAL DATA: Left lower quadrant pain, hematuria, history of
kidney stones

EXAM:
RENAL / URINARY TRACT ULTRASOUND COMPLETE

[Series 1: us renal · 0.25mm/px · 14 of 47 slices shown]
[im 1/47]
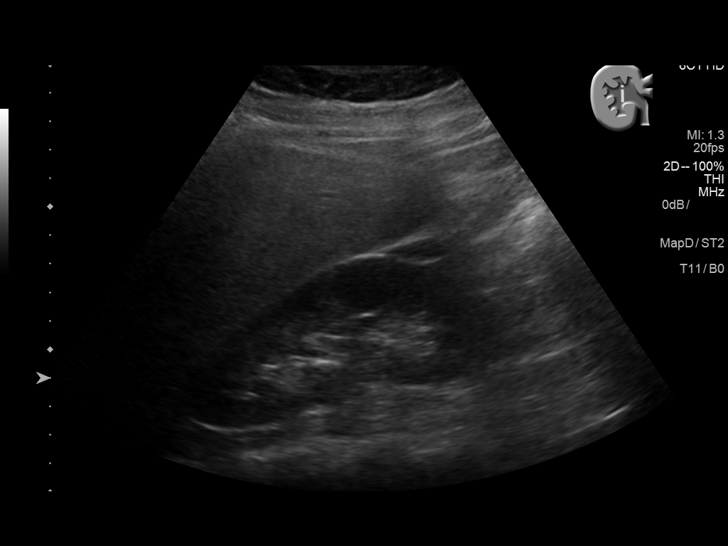
[im 4/47]
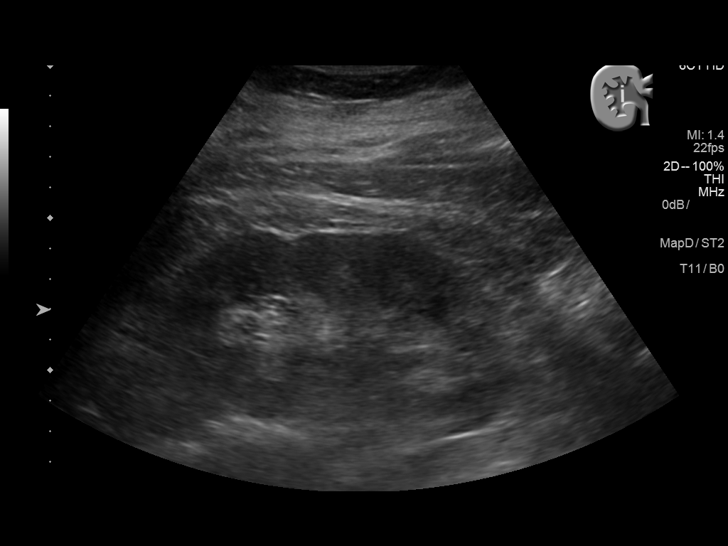
[im 8/47]
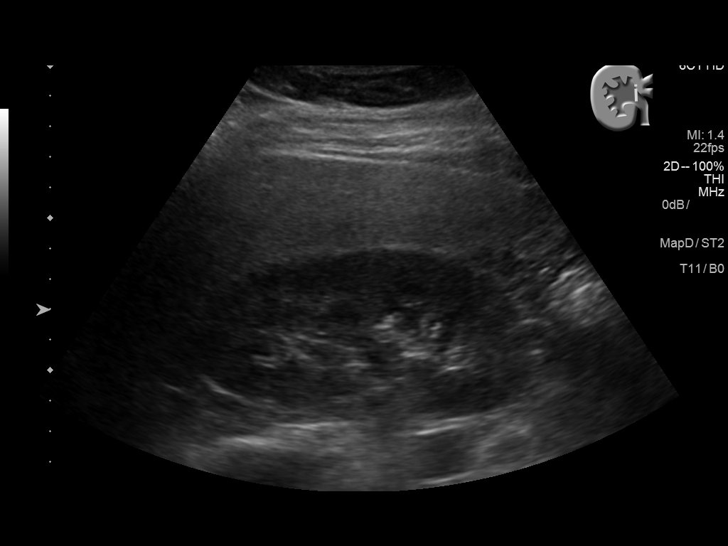
[im 12/47]
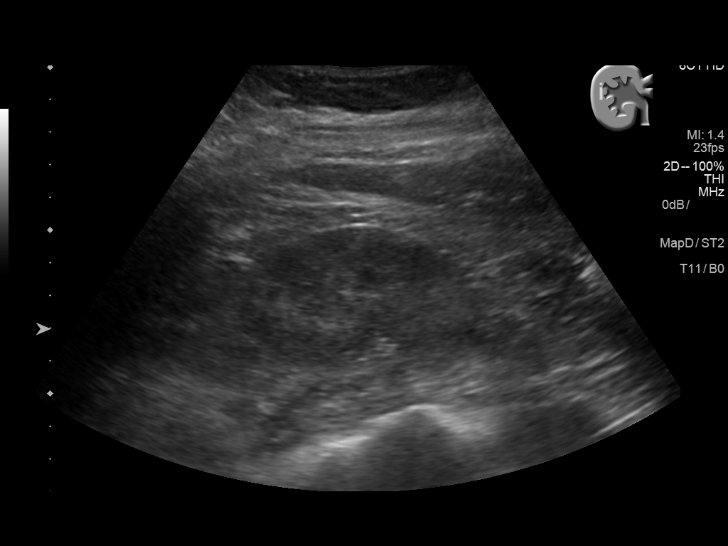
[im 16/47]
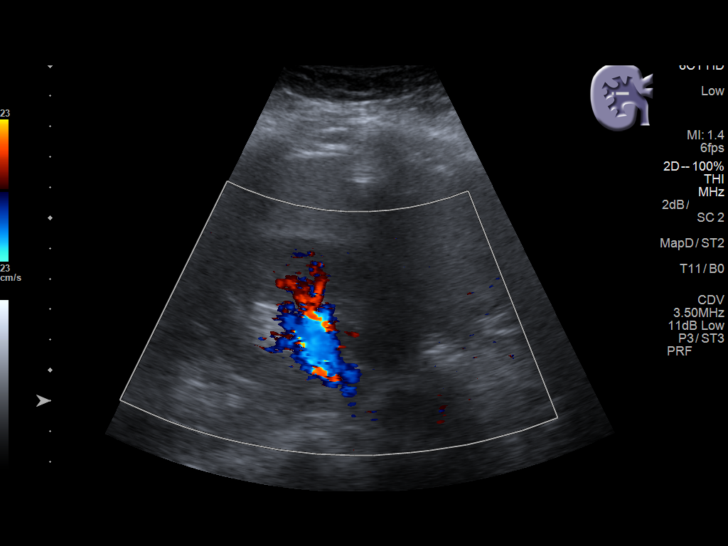
[im 18/47]
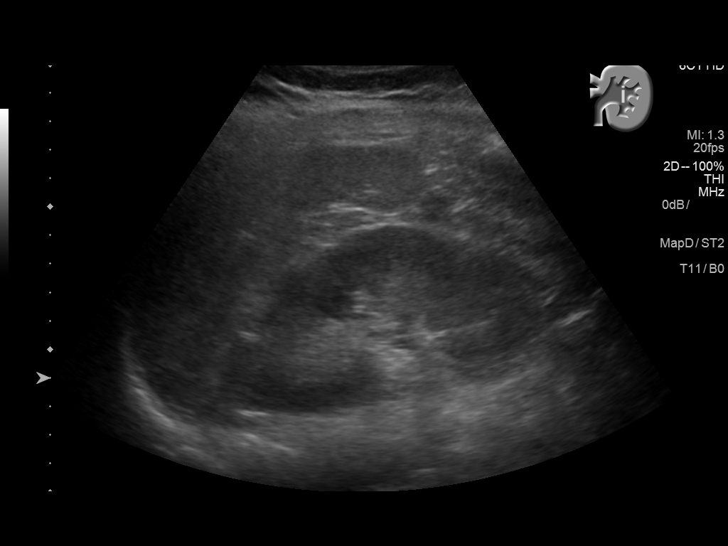
[im 22/47]
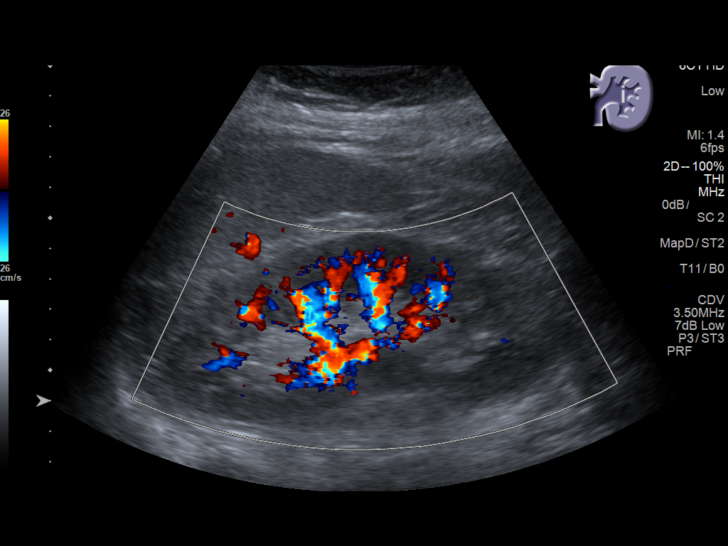
[im 25/47]
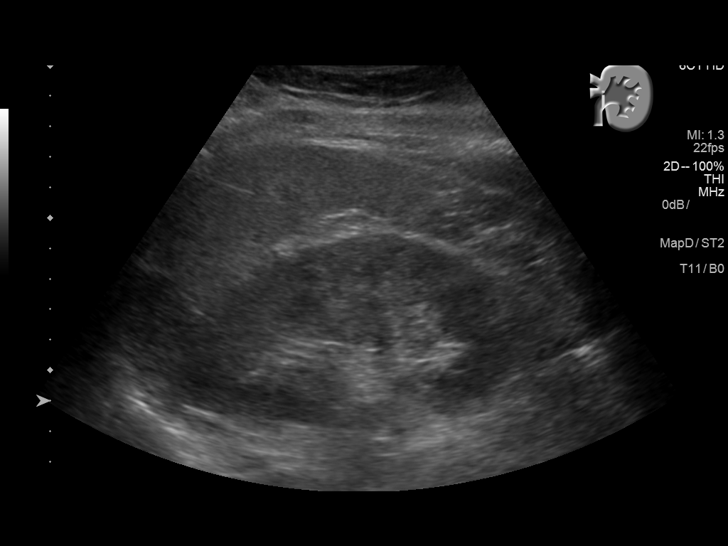
[im 29/47]
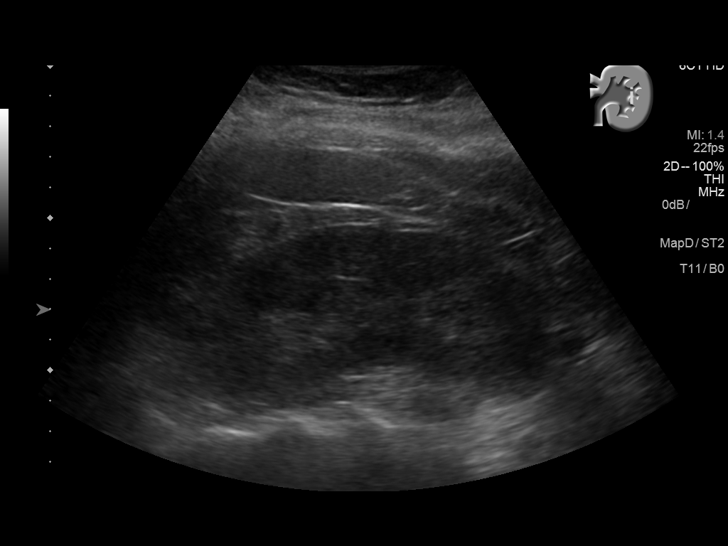
[im 31/47]
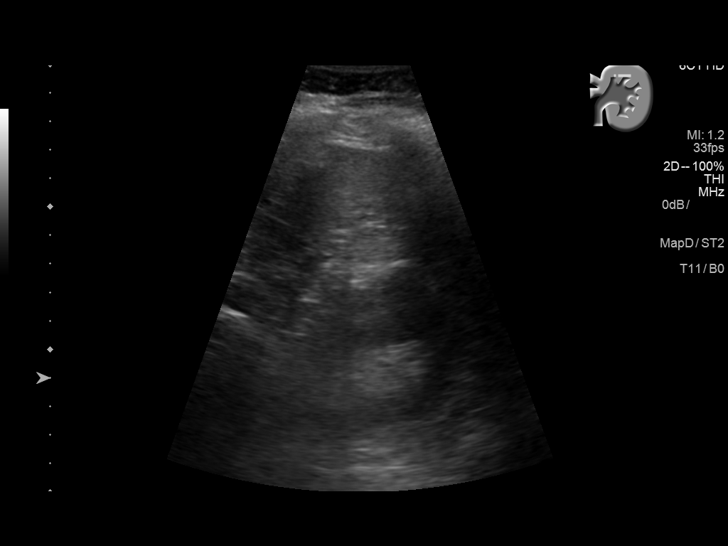
[im 35/47]
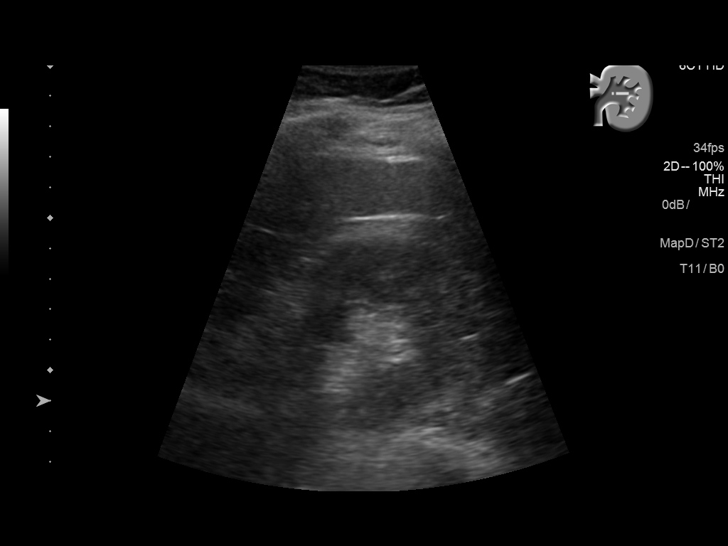
[im 39/47]
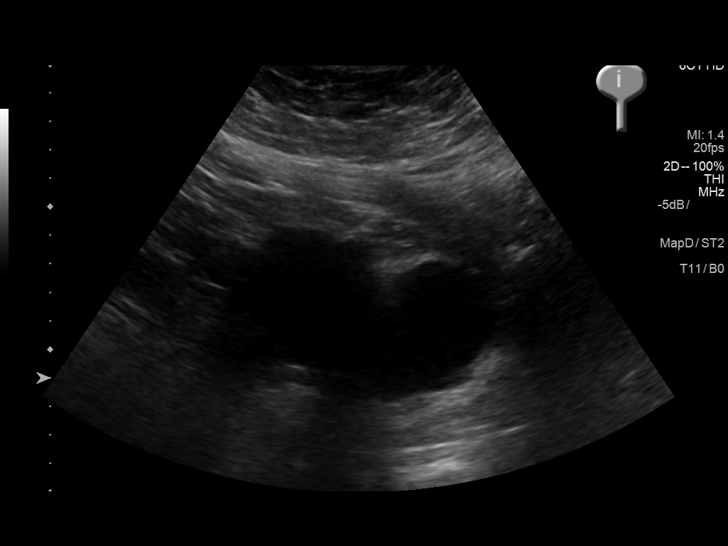
[im 43/47]
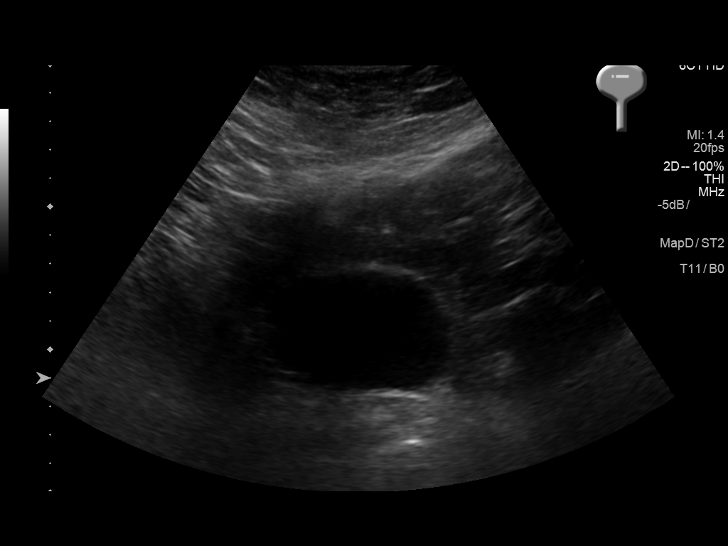
[im 47/47]
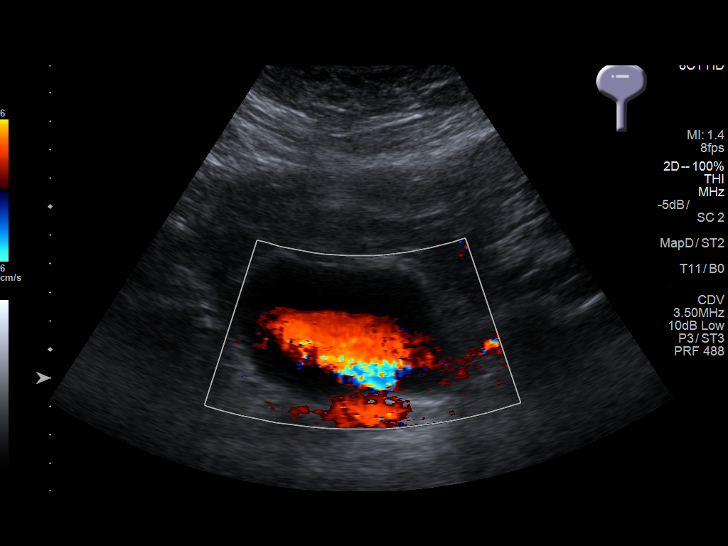

[14 of 25 positions shown; findings below may reference images not displayed]

FINDINGS: Right Kidney:

Length: 11.5 cm. No hydronephrosis is seen. No echogenic foci are
noted.

Left Kidney:

Length: 13.4 cm.. No hydronephrosis is noted. No echogenic foci are
seen to indicate renal calculi.

Bladder:

The urinary bladder is not well distended but no abnormality is
noted. Bilateral ureteral jets are visualized.
IMPRESSION: Negative ultrasound of the kidneys. No hydronephrosis. No renal
calculi are evident by ultrasound.

## 2019-12-16 ENCOUNTER — Ambulatory Visit: Payer: Self-pay | Attending: Internal Medicine

## 2019-12-16 DIAGNOSIS — Z23 Encounter for immunization: Secondary | ICD-10-CM

## 2019-12-16 NOTE — Progress Notes (Signed)
   Covid-19 Vaccination Clinic  Name:  Brittany Petersen    MRN: [id number] DOB: [birth year]  12/16/2019  Ms. Brittany Petersen was observed post Covid-19 immunization for 15 minutes without incident. She was provided with Vaccine Information Sheet and instruction to access the V-Safe system.   Ms. Brittany Petersen was instructed to call 911 with any severe reactions post vaccine: Marland Kitchen Difficulty breathing  . Swelling of face and throat  . A fast heartbeat  . A bad rash all over body  . Dizziness and weakness   Immunizations Administered    Name Date Dose VIS Date Route   Pfizer COVID-19 Vaccine 12/16/2019  1:11 AM 0.3 mL 09/02/2019 Intramuscular   Manufacturer: [employer], [employer]   Lot: [id number]   NDC: [address]-3

## 2020-01-06 ENCOUNTER — Ambulatory Visit: Payer: Self-pay | Attending: Internal Medicine

## 2020-01-06 DIAGNOSIS — Z23 Encounter for immunization: Secondary | ICD-10-CM

## 2020-01-06 NOTE — Progress Notes (Signed)
   Covid-19 Vaccination Clinic  Name:  Calisha Tindel    MRN: [id number] DOB: 1986-08-15  01/06/2020  Ms. Tarshia Kot was observed post Covid-19 immunization for 15 minutes without incident. She was provided with Vaccine Information Sheet and instruction to access the V-Safe system.   Ms. Avarie Tavano was instructed to call 911 with any severe reactions post vaccine: Marland Kitchen Difficulty breathing  . Swelling of face and throat  . A fast heartbeat  . A bad rash all over body  . Dizziness and weakness   Immunizations Administered    Name Date Dose VIS Date Route   Pfizer COVID-19 Vaccine 01/06/2020 12:20 PM 0.3 mL 09/02/2019 Intramuscular   Manufacturer: [employer], [employer]   Lot: [id number]   NDC: [address]-6

## 2024-07-28 ENCOUNTER — Other Ambulatory Visit: Payer: Self-pay

## 2024-07-28 ENCOUNTER — Emergency Department
Admission: EM | Admit: 2024-07-28 | Discharge: 2024-07-28 | Disposition: A | Discharge status: Home / Self Care | Payer: Self-pay | Attending: Emergency Medicine | Admitting: Emergency Medicine

## 2024-07-28 DIAGNOSIS — R519 Headache, unspecified: Secondary | ICD-10-CM | POA: Insufficient documentation

## 2024-07-28 MED ORDER — SODIUM CHLORIDE 0.9 % IV BOLUS
1000.0000 mL | Freq: Once | INTRAVENOUS | Status: AC
  Administered 2024-07-28: 1000 mL via INTRAVENOUS

## 2024-07-28 MED ORDER — METOCLOPRAMIDE HCL 5 MG/ML IJ SOLN
10.0000 mg | Freq: Once | INTRAMUSCULAR | Status: AC
  Administered 2024-07-28: 10 mg via INTRAVENOUS
  Filled 2024-07-28: qty 2

## 2024-07-28 MED ORDER — KETOROLAC TROMETHAMINE 15 MG/ML IJ SOLN
15.0000 mg | Freq: Once | INTRAMUSCULAR | Status: AC
  Administered 2024-07-28: 15 mg via INTRAVENOUS
  Filled 2024-07-28: qty 1

## 2024-07-28 NOTE — ED Triage Notes (Signed)
 Patient reports headache and nausea since 0500; history of same.   Interpreter [phone]

## 2024-07-28 NOTE — Discharge Instructions (Signed)
 Please follow-up with neurology given your persistent and reoccurring headaches.  Please return for any new, worsening, or changing symptoms or other concerns.  It was a pleasure caring for you today.

## 2024-07-28 NOTE — ED Notes (Signed)
 See triage note  Presents with headache since 5 am Afebrile on arrival

## 2024-07-28 NOTE — ED Provider Notes (Signed)
 [facility] Provider Note    Event Date/Time   First MD Initiated Contact with Patient 07/28/24 1239     (approximate)   History   Headache   HPI  Brittany Petersen is a 38 y.o. female with a past medical history of migraine headaches who presents today for evaluation of headache that she reports is exactly same as her previous headaches.  She reports that she gets these 3-4 times per month.  She has not yet seen a neurologist.  She reports that this has been ongoing for approximately 10 years.  She has nausea and vomiting which is typical of her headaches.  She denies paresthesias or weakness in her extremities.  No visual changes.  No trauma.  There are no active problems to display for this patient.         Physical Exam   Triage Vital Signs: ED Triage Vitals  Encounter Vitals Group     BP 07/28/24 1147 (!) 134/92     Girls Systolic BP Percentile --      Girls Diastolic BP Percentile --      Boys Systolic BP Percentile --      Boys Diastolic BP Percentile --      Pulse Rate 07/28/24 1147 78     Resp 07/28/24 1147 20     Temp 07/28/24 1147 98.5 F (36.9 C)     Temp Source 07/28/24 1147 Oral     SpO2 07/28/24 1147 100 %     Weight --      Height --      Head Circumference --      Peak Flow --      Pain Score 07/28/24 1146 10     Pain Loc --      Pain Education --      Exclude from Growth Chart --     Most recent vital signs: Vitals:   07/28/24 1147  BP: (!) 134/92  Pulse: 78  Resp: 20  Temp: 98.5 F (36.9 C)  SpO2: 100%    Physical Exam Vitals and nursing note reviewed.  Constitutional:      General: Awake and alert. No acute distress.    Appearance: Normal appearance. The patient is normal weight.  HENT:     Head: Normocephalic and atraumatic.     Mouth: Mucous membranes are moist.  Eyes:     General: PERRL. Normal EOMs        Right eye: No discharge.        Left eye: No discharge.     Conjunctiva/sclera:  Conjunctivae normal.  Cardiovascular:     Rate and Rhythm: Normal rate and regular rhythm.     Pulses: Normal pulses.  Pulmonary:     Effort: Pulmonary effort is normal. No respiratory distress.     Breath sounds: Normal breath sounds.  Abdominal:     Abdomen is soft. There is no abdominal tenderness. No rebound or guarding. No distention. Musculoskeletal:        General: No swelling. Normal range of motion.     Cervical back: Normal range of motion and neck supple.  Skin:    General: Skin is warm and dry.     Capillary Refill: Capillary refill takes less than 2 seconds.     Findings: No rash.  Neurological:     Mental Status: The patient is awake and alert.   Neurological: GCS 15 alert and oriented x3 Normal speech, no expressive or receptive aphasia  or dysarthria Cranial nerves II through XII intact Normal visual fields 5 out of 5 strength in all 4 extremities with intact sensation throughout No extremity drift Normal finger-to-nose testing, no limb or truncal ataxia    ED Results / Procedures / Treatments   Labs (all labs ordered are listed, but only abnormal results are displayed) Labs Reviewed - No data to display   EKG     RADIOLOGY     PROCEDURES:  Critical Care performed:   Procedures   MEDICATIONS ORDERED IN ED: Medications  ketorolac  (TORADOL ) 15 MG/ML injection 15 mg (15 mg Intravenous Given 07/28/24 1345)  metoCLOPramide (REGLAN) injection 10 mg (10 mg Intravenous Given 07/28/24 1348)  sodium chloride  0.9 % bolus 1,000 mL (0 mLs Intravenous Stopped 07/28/24 1446)     IMPRESSION / MDM / ASSESSMENT AND PLAN / ED COURSE  I reviewed the triage vital signs and the nursing notes.   Differential diagnosis includes, but is not limited to, migraine headache, tension headache, cluster headache.  Patient presented with a chief concern of a headache. Gradual in onset, and is exactly the same as her previous headaches that she has had 3-4 times a month  for the past 10 years.  There is no history or physical exam findings to suggest encephalopathy; no altered mental status, fever or meningismus, vision changes, vomiting or focal neurological deficit and improved with treatment in the emergency department. Therefore, I have low suspicion for concerning process that would require urgent or emergent imaging or diagnostic/therapeutic procedural intervention such as lumbar puncture. Doubt meningitis as there is no fever, photophobia, neck symptoms, altered mental status. Additionally the patient is not known to be immunocompromised. No history of trauma, doubt subdural or epidural hematoma. No dizziness or other neurologic symptoms so cerebellar infarction or other hemorrhagic stroke are unlikely. Intracranial mass unlikely given that the headache is not getting progressively worse, is not worse in the morning, there are no other neurologic symptoms, and the neurologic exam is grossly normal. Unlikely to be giant cell arteritis as there is no tenderness over temporal artery or vision changes. Doubt CO toxicity as no known exposure and no other family members have a headache. No neck pain and was not sudden onset or associated with movement of the neck and no dizziness,  doubt carotid artery dissection. No occipital tenderness so occipital neuralgia seems less likely.  She was treated symptomatically with a headache cocktail with resolution of her headache.  Return precautions discussed, patient to follow-up closely with outpatient provider.  She was instructed to follow-up with neurology given her reoccurring and persistent headaches.  We also discussed return precautions.  Patient understands and agrees with plan.  Discharged in stable condition.    Patient's presentation is most consistent with exacerbation of chronic illness.  Clinical Course as of 07/28/24 1458  Thu Jul 28, 2024  1434 Patient reports that she feels significantly improved and ready for  discharge home [JP]    Clinical Course User Index [JP] Carrera Kiesel E, PA-C     FINAL CLINICAL IMPRESSION(S) / ED DIAGNOSES   Final diagnoses:  Nonintractable headache, unspecified chronicity pattern, unspecified headache type     Rx / DC Orders   ED Discharge Orders     None        Note:  This document was prepared using Dragon voice recognition software and may include unintentional dictation errors.   Maddox Bratcher E, PA-C 07/28/24 1458    Viviann Pastor, MD 07/30/24 2352
